# Patient Record
Sex: Male | Born: 2000 | Race: White | Hispanic: No | Marital: Single | Smoking: Never smoker
Health system: Southern US, Community
[De-identification: ages and names within clinical notes are randomized; demographics above are authoritative.]

## PROBLEM LIST (undated history)

## (undated) HISTORY — PX: HERNIA REPAIR: SHX51

---
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
## 2020-02-25 ENCOUNTER — Ambulatory Visit: Payer: Self-pay | Admitting: Nurse Practitioner

## 2020-03-04 ENCOUNTER — Ambulatory Visit (INDEPENDENT_AMBULATORY_CARE_PROVIDER_SITE_OTHER): Payer: Medicaid Other | Admitting: Nurse Practitioner

## 2020-03-04 DIAGNOSIS — Z5329 Procedure and treatment not carried out because of patient's decision for other reasons: Secondary | ICD-10-CM

## 2020-03-04 NOTE — Progress Notes (Signed)
no show

## 2020-06-24 DIAGNOSIS — F209 Schizophrenia, unspecified: Secondary | ICD-10-CM | POA: Diagnosis not present

## 2020-06-24 DIAGNOSIS — E785 Hyperlipidemia, unspecified: Secondary | ICD-10-CM | POA: Diagnosis not present

## 2020-06-24 DIAGNOSIS — G4709 Other insomnia: Secondary | ICD-10-CM | POA: Diagnosis not present

## 2020-06-24 DIAGNOSIS — E039 Hypothyroidism, unspecified: Secondary | ICD-10-CM | POA: Diagnosis not present

## 2020-06-24 DIAGNOSIS — F411 Generalized anxiety disorder: Secondary | ICD-10-CM | POA: Diagnosis not present

## 2020-07-08 DIAGNOSIS — F209 Schizophrenia, unspecified: Secondary | ICD-10-CM | POA: Diagnosis not present

## 2020-07-08 DIAGNOSIS — G4709 Other insomnia: Secondary | ICD-10-CM | POA: Diagnosis not present

## 2020-07-23 ENCOUNTER — Ambulatory Visit (INDEPENDENT_AMBULATORY_CARE_PROVIDER_SITE_OTHER): Payer: Medicaid Other | Admitting: Internal Medicine

## 2020-07-29 DIAGNOSIS — F209 Schizophrenia, unspecified: Secondary | ICD-10-CM | POA: Diagnosis not present

## 2020-08-12 DIAGNOSIS — R059 Cough, unspecified: Secondary | ICD-10-CM | POA: Diagnosis not present

## 2020-08-12 DIAGNOSIS — R509 Fever, unspecified: Secondary | ICD-10-CM | POA: Diagnosis not present

## 2020-08-12 DIAGNOSIS — R0989 Other specified symptoms and signs involving the circulatory and respiratory systems: Secondary | ICD-10-CM | POA: Diagnosis not present

## 2020-08-12 DIAGNOSIS — Z20822 Contact with and (suspected) exposure to covid-19: Secondary | ICD-10-CM | POA: Diagnosis not present

## 2020-08-13 ENCOUNTER — Telehealth: Payer: Self-pay

## 2020-08-13 NOTE — Telephone Encounter (Signed)
Transition Care Management Unsuccessful Follow-up Telephone Call  Date of discharge and from where:  08/12/2020 [facility] ED  Attempts:  1st Attempt  Reason for unsuccessful TCM follow-up call:  Unable to reach patient

## 2020-08-14 NOTE — Telephone Encounter (Signed)
Transition Care Management Unsuccessful Follow-up Telephone Call  Date of discharge and from where:  08/12/2020 [facility] ED  Attempts:  2nd Attempt  Reason for unsuccessful TCM follow-up call:  Left voice message

## 2020-08-15 NOTE — Telephone Encounter (Signed)
Transition Care Management Unsuccessful Follow-up Telephone Call  Date of discharge and from where:  08/12/2020 [facility] ED  Attempts:  3rd Attempt  Reason for unsuccessful TCM follow-up call:  Left voice message

## 2020-08-20 DIAGNOSIS — R509 Fever, unspecified: Secondary | ICD-10-CM | POA: Diagnosis not present

## 2020-08-20 DIAGNOSIS — R059 Cough, unspecified: Secondary | ICD-10-CM | POA: Diagnosis not present

## 2020-08-20 DIAGNOSIS — J029 Acute pharyngitis, unspecified: Secondary | ICD-10-CM | POA: Diagnosis not present

## 2020-08-21 ENCOUNTER — Telehealth: Payer: Self-pay

## 2020-08-21 NOTE — Telephone Encounter (Signed)
Transition Care Management Unsuccessful Follow-up Telephone Call  Date of discharge and from where:  08/20/2020 [facility] ED  Attempts:  1st Attempt  Reason for unsuccessful TCM follow-up call:  No answer/busy

## 2020-08-22 NOTE — Telephone Encounter (Signed)
Transition Care Management Unsuccessful Follow-up Telephone Call  Date of discharge and from where:  08/20/2020 [facility] ED  Attempts:  2nd Attempt  Reason for unsuccessful TCM follow-up call:  Unable to reach patient

## 2020-08-26 NOTE — Telephone Encounter (Signed)
Transition Care Management Unsuccessful Follow-up Telephone Call  Date of discharge and from where:  08/20/2020 [facility] ED  Attempts:  3rd Attempt  Reason for unsuccessful TCM follow-up call:  Unable to reach patient

## 2021-03-18 ENCOUNTER — Emergency Department (HOSPITAL_COMMUNITY)
Admission: EM | Admit: 2021-03-18 | Discharge: 2021-03-18 | Disposition: A | Discharge status: Home / Self Care | Payer: Medicaid Other | Attending: Emergency Medicine | Admitting: Emergency Medicine

## 2021-03-18 ENCOUNTER — Other Ambulatory Visit: Payer: Self-pay

## 2021-03-18 ENCOUNTER — Encounter (HOSPITAL_COMMUNITY): Payer: Self-pay | Admitting: Student

## 2021-03-18 ENCOUNTER — Emergency Department (HOSPITAL_COMMUNITY): Payer: Medicaid Other

## 2021-03-18 DIAGNOSIS — J45909 Unspecified asthma, uncomplicated: Secondary | ICD-10-CM | POA: Insufficient documentation

## 2021-03-18 DIAGNOSIS — U071 COVID-19: Secondary | ICD-10-CM | POA: Insufficient documentation

## 2021-03-18 DIAGNOSIS — R509 Fever, unspecified: Secondary | ICD-10-CM | POA: Diagnosis present

## 2021-03-18 LAB — RESP PANEL BY RT-PCR (FLU A&B, COVID) ARPGX2
Influenza A by PCR: NEGATIVE
Influenza B by PCR: NEGATIVE
SARS Coronavirus 2 by RT PCR: POSITIVE — AB

## 2021-03-18 MED ORDER — FLUTICASONE PROPIONATE 50 MCG/ACT NA SUSP: 1.0000 | Freq: Every day | NASAL | 0 refills | Status: AC

## 2021-03-18 MED ORDER — BENZONATATE 100 MG PO CAPS: 100.0000 mg | ORAL_CAPSULE | Freq: Three times a day (TID) | ORAL | 0 refills | Status: AC | PRN

## 2021-03-18 NOTE — Discharge Instructions (Addendum)
Your COVID-19 test returned positive in the emergency department.  Given your fever to started yesterday, yesterday will be your day 0 of symptoms. We are instructing patient's with COVID 19 or symptoms of COVID 19 to follow the below instructions regardless of vaccination status:  Your first day of symptoms is day 0 - Stay home for days 0-5.  - If you have no symptoms or your symptoms are resolving after 5 days, you can leave your house on day 6--> Continue to wear a mask around others for 5 additional days. - If you have a fever, continue to stay home until your fever resolves.  We are sending home with Flonase use 1 spray per nostril daily as needed for congestion and Tessalon to take every 8 hours as needed for coughing.  Please take Motrin/Tylenol per over-the-counter dosing to help with discomfort/fevers.  We have prescribed you new medication(s) today. Discuss the medications prescribed today with your pharmacist as they can have adverse effects and interactions with your other medicines including over the counter and prescribed medications. Seek medical evaluation if you start to experience new or abnormal symptoms after taking one of these medicines, seek care immediately if you start to experience difficulty breathing, feeling of your throat closing, facial swelling, or rash as these could be indications of a more serious allergic reaction   Please follow up with primary care within 3-5 days for re-evaluation- call prior to going to the office to make them aware of your symptoms as some offices are altering their method of seeing patients with COVID 19 symptoms, we have also provided our [place] covid clinic for follow up as well.  Return to the ER for new or worsening symptoms including but not limited to increased work of breathing, chest pain, passing out, or any other concerns.       Person Under Monitoring Name: Frederick Pearson  Location: [address] [place]  [address]   Infection Prevention Recommendations for Individuals Confirmed to have, or Being Evaluated for, 2019 Novel Coronavirus (COVID-19) Infection Who Receive Care at Home  Individuals who are confirmed to have, or are being evaluated for, COVID-19 should follow the prevention steps below until a healthcare provider or local or state health department says they can return to normal activities.  Stay home except to get medical care You should restrict activities outside your home, except for getting medical care. Do not go to work, school, or public areas, and do not use public transportation or taxis.  Call ahead before visiting your doctor Before your medical appointment, call the healthcare provider and tell them that you have, or are being evaluated for, COVID-19 infection. This will help the healthcare provider's office take steps to keep other people from getting infected. Ask your healthcare provider to call the local or state health department.  Monitor your symptoms Seek prompt medical attention if your illness is worsening (e.g., difficulty breathing). Before going to your medical appointment, call the healthcare provider and tell them that you have, or are being evaluated for, COVID-19 infection. Ask your healthcare provider to call the local or state health department.  Wear a facemask You should wear a facemask that covers your nose and mouth when you are in the same room with other people and when you visit a healthcare provider. People who live with or visit you should also wear a facemask while they are in the same room with you.  Separate yourself from other people in your home  As much as possible, you should stay in a different room from other people in your home. Also, you should use a separate bathroom, if available.  Avoid sharing household items You should not share dishes, drinking glasses, cups, eating utensils, towels, bedding, or other items with other  people in your home. After using these items, you should wash them thoroughly with soap and water.  Cover your coughs and sneezes Cover your mouth and nose with a tissue when you cough or sneeze, or you can cough or sneeze into your sleeve. Throw used tissues in a lined trash can, and immediately wash your hands with soap and water for at least 20 seconds or use an alcohol-based hand rub.  Wash your [employer] your hands often and thoroughly with soap and water for at least 20 seconds. You can use an alcohol-based hand sanitizer if soap and water are not available and if your hands are not visibly dirty. Avoid touching your eyes, nose, and mouth with unwashed hands.   Prevention Steps for Caregivers and Household Members of Individuals Confirmed to have, or Being Evaluated for, COVID-19 Infection Being Cared for in the Home  If you live with, or provide care at home for, a person confirmed to have, or being evaluated for, COVID-19 infection please follow these guidelines to prevent infection:  Follow healthcare provider's instructions Make sure that you understand and can help the patient follow any healthcare provider instructions for all care.  Provide for the patient's basic needs You should help the patient with basic needs in the home and provide support for getting groceries, prescriptions, and other personal needs.  Monitor the patient's symptoms If they are getting sicker, call his or her medical provider and tell them that the patient has, or is being evaluated for, COVID-19 infection. This will help the healthcare provider's office take steps to keep other people from getting infected. Ask the healthcare provider to call the local or state health department.  Limit the number of people who have contact with the patient If possible, have only one caregiver for the patient. Other household members should stay in another home or place of residence. If this is not possible,  they should stay in another room, or be separated from the patient as much as possible. Use a separate bathroom, if available. Restrict visitors who do not have an essential need to be in the home.  Keep older adults, very young children, and other sick people away from the patient Keep older adults, very young children, and those who have compromised immune systems or chronic health conditions away from the patient. This includes people with chronic heart, lung, or kidney conditions, diabetes, and cancer.  Ensure good ventilation Make sure that shared spaces in the home have good air flow, such as from an air conditioner or an opened window, weather permitting.  Wash your hands often Wash your hands often and thoroughly with soap and water for at least 20 seconds. You can use an alcohol based hand sanitizer if soap and water are not available and if your hands are not visibly dirty. Avoid touching your eyes, nose, and mouth with unwashed hands. Use disposable paper towels to dry your hands. If not available, use dedicated cloth towels and replace them when they become wet.  Wear a facemask and gloves Wear a disposable facemask at all times in the room and gloves when you touch or have contact with the patient's blood, body fluids, and/or secretions or excretions, such  as sweat, saliva, sputum, nasal mucus, vomit, urine, or feces.  Ensure the mask fits over your nose and mouth tightly, and do not touch it during use. Throw out disposable facemasks and gloves after using them. Do not reuse. Wash your hands immediately after removing your facemask and gloves. If your personal clothing becomes contaminated, carefully remove clothing and launder. Wash your hands after handling contaminated clothing. Place all used disposable facemasks, gloves, and other waste in a lined container before disposing them with other household waste. Remove gloves and wash your hands immediately after handling these  items.  Do not share dishes, glasses, or other household items with the patient Avoid sharing household items. You should not share dishes, drinking glasses, cups, eating utensils, towels, bedding, or other items with a patient who is confirmed to have, or being evaluated for, COVID-19 infection. After the person uses these items, you should wash them thoroughly with soap and water.  Wash laundry thoroughly Immediately remove and wash clothes or bedding that have blood, body fluids, and/or secretions or excretions, such as sweat, saliva, sputum, nasal mucus, vomit, urine, or feces, on them. Wear gloves when handling laundry from the patient. Read and follow directions on labels of laundry or clothing items and detergent. In general, wash and dry with the warmest temperatures recommended on the label.  Clean all areas the individual has used often Clean all touchable surfaces, such as counters, tabletops, doorknobs, bathroom fixtures, toilets, phones, keyboards, tablets, and bedside tables, every day. Also, clean any surfaces that may have blood, body fluids, and/or secretions or excretions on them. Wear gloves when cleaning surfaces the patient has come in contact with. Use a diluted bleach solution (e.g., dilute bleach with 1 part bleach and 10 parts water) or a household disinfectant with a label that says EPA-registered for coronaviruses. To make a bleach solution at home, add 1 tablespoon of bleach to 1 quart (4 cups) of water. For a larger supply, add  cup of bleach to 1 gallon (16 cups) of water. Read labels of cleaning products and follow recommendations provided on product labels. Labels contain instructions for safe and effective use of the cleaning product including precautions you should take when applying the product, such as wearing gloves or eye protection and making sure you have good ventilation during use of the product. Remove gloves and wash hands immediately after  cleaning.  Monitor yourself for signs and symptoms of illness Caregivers and household members are considered close contacts, should monitor their health, and will be asked to limit movement outside of the home to the extent possible. Follow the monitoring steps for close contacts listed on the symptom monitoring form.   ? If you have additional questions, contact your local health department or call the epidemiologist on call at [phone] (available 24/7). ? This guidance is subject to change. For the most up-to-date guidance from [facility], please refer to their website: [email or web address]

## 2021-03-18 NOTE — ED Provider Notes (Signed)
[facility] EMERGENCY DEPARTMENT Provider Note   CSN: [id number] Arrival date & time: 03/18/21  1149     History Chief Complaint  Patient presents with   Covid Exposure    Frederick Pearson is a 20 y.o. male with a history of asthma who presents to the emergency department with complaints of fever that started yesterday.  Patient states he has been congested with some ear discomfort and cough for the past 2 weeks, yesterday spiked a high fever.  No alleviating or aggravating factors.  No intervention prior to arrival.  He has had multiple sick contacts with similar been diagnosed with COVID.  He denies chest pain, dyspnea, wheezing, nausea, vomiting, abdominal pain, or diarrhea.  HPI     Past Medical History:  Diagnosis Date   Asthma     There are no problems to display for this patient.   Past Surgical History:  Procedure Laterality Date   HERNIA REPAIR         No family history on file.  Social History   Tobacco Use   Smoking status: Never   Smokeless tobacco: Former  Substance Use Topics   Alcohol use: No   Drug use: No    Home Medications Prior to Admission medications   Not on File    Allergies    Amoxicillin and Penicillins  Review of Systems   Review of Systems  Constitutional:  Positive for fever.  HENT:  Positive for congestion, ear pain and sore throat.   Respiratory:  Positive for cough. Negative for shortness of breath and wheezing.   Cardiovascular:  Negative for chest pain and leg swelling.  Gastrointestinal:  Negative for abdominal pain, nausea and vomiting.  Neurological:  Negative for syncope.  All other systems reviewed and are negative.  Physical Exam Updated Vital Signs BP 118/69 (BP Location: Right Arm)   Pulse (!) 57   Temp 97.8 F (36.6 C) (Oral)   Resp 17   Ht 5\' 11"  (1.803 m)   Wt 89.8 kg   SpO2 97%   BMI 27.61 kg/m   Physical Exam Vitals and nursing note reviewed.  Constitutional:      General: He is not in acute  distress.    Appearance: He is well-developed. He is not toxic-appearing.  HENT:     Head: Normocephalic and atraumatic.     Right Ear: Ear canal normal. Tympanic membrane is not perforated, erythematous, retracted or bulging.     Left Ear: Ear canal normal. Tympanic membrane is not perforated, erythematous, retracted or bulging.     Ears:     Comments: No mastoid erythema/swellng/tenderness.     Nose:     Right Sinus: No maxillary sinus tenderness or frontal sinus tenderness.     Left Sinus: No maxillary sinus tenderness or frontal sinus tenderness.     Mouth/Throat:     Pharynx: Oropharynx is clear. Uvula midline. No oropharyngeal exudate or posterior oropharyngeal erythema.     Comments: Posterior oropharynx is symmetric appearing. Patient tolerating own secretions without difficulty. No trismus. No drooling. No hot potato voice. No swelling beneath the tongue, submandibular compartment is soft.  Eyes:     General:        Right eye: No discharge.        Left eye: No discharge.     Conjunctiva/sclera: Conjunctivae normal.  Cardiovascular:     Rate and Rhythm: Normal rate and regular rhythm.  Pulmonary:     Effort: Pulmonary effort is normal. No  respiratory distress.     Breath sounds: Normal breath sounds. No wheezing, rhonchi or rales.  Abdominal:     General: There is no distension.     Palpations: Abdomen is soft.     Tenderness: There is no abdominal tenderness.  Musculoskeletal:     Cervical back: Neck supple. No rigidity.  Lymphadenopathy:     Cervical: No cervical adenopathy.  Skin:    General: Skin is warm and dry.     Findings: No rash.  Neurological:     Mental Status: He is alert.  Psychiatric:        Behavior: Behavior normal.    ED Results / Procedures / Treatments   Labs (all labs ordered are listed, but only abnormal results are displayed) Labs Reviewed  RESP PANEL BY RT-PCR (FLU A&B, COVID) ARPGX2 - Abnormal; Notable for the following components:       Result Value   SARS Coronavirus 2 by RT PCR POSITIVE (*)    All other components within normal limits    EKG None  Radiology DG Chest Portable 1 View  Result Date: 03/18/2021 CLINICAL DATA:  Cough EXAM: PORTABLE CHEST 1 VIEW COMPARISON:  03/11/2021 FINDINGS: The heart size and mediastinal contours are within normal limits. Both lungs are clear. The visualized skeletal structures are unremarkable. IMPRESSION: No active disease. Electronically Signed   By: Signa Kell M.D.   On: 03/18/2021 15:55    Procedures Procedures   Medications Ordered in ED Medications - No data to display  ED Course  I have reviewed the triage vital signs and the nursing notes.  Pertinent labs & imaging results that were available during my care of the patient were reviewed by me and considered in my medical decision making (see chart for details).   Frederick Pearson was evaluated in Emergency Department on 03/18/2021 for the symptoms described in the history of present illness. He/she was evaluated in the context of the global COVID-19 pandemic, which necessitated consideration that the patient might be at risk for infection with the SARS-CoV-2 virus that causes COVID-19. Institutional protocols and algorithms that pertain to the evaluation of patients at risk for COVID-19 are in a state of rapid change based on information released by regulatory bodies including the CDC and federal and state organizations. These policies and algorithms were followed during the patient's care in the ED.  MDM Rules/Calculators/A&P                          Patient presents to the ED with URI sxs x 2 weeks, fever yesterday. Patient is nontoxic, in no acute distress, vitals fairly unremarkable.   Additional history obtained:  Additional history obtained from chart review & nursing note review.   Lab Tests:  I Ordered, reviewed, and interpreted labs, which included:  Covid: Positive Influenza; Negative.  Imaging Studies ordered:   I ordered imaging studies which included CXR, I independently visualized and interpreted imaging which showed No active disease.   ED Course:  Exam is without signs of AOM, AOE, or mastoiditis. Oropharyngeal exam is benign, doubt strep/PRA/PTA. No sinus tenderness. No meningeal signs. Lungs are CTA without focal adventitious sounds, no signs of increased work of breathing, CXR without infiltrate, doubt CAP. No wheezing to suggest asthma exacerbation.  COVID testing is positive which is likely the underlying etiology of patient's symptoms.  Given he just developed fever yesterday instructed him to treat this is day 0.  We discussed symptomatic  care versus different antiviral therapy, risk/benefits discussed, shared decision making- will proceed with supportive care. I discussed results, treatment plan, need for follow-up, and return precautions with the patient. Provided opportunity for questions, patient confirmed understanding and is in agreement with plan.   Portions of this note were generated with Scientist, clinical (histocompatibility and immunogenetics). Dictation errors may occur despite best attempts at proofreading.  Final Clinical Impression(s) / ED Diagnoses Final diagnoses:  COVID-19    Rx / DC Orders ED Discharge Orders          Ordered    fluticasone (FLONASE) 50 MCG/ACT nasal spray  Daily        03/18/21 1632    benzonatate (TESSALON) 100 MG capsule  3 times daily PRN        03/18/21 1632             Brelynn Wheller, Pleas Koch, PA-C 03/18/21 1638    Eber Hong, MD 03/19/21 1254

## 2021-03-18 NOTE — ED Triage Notes (Signed)
Pt. States everyone in the house has covid. Pt. States they haven't felt well in 2 weeks. Pt. Complains of earache and throat pain.

## 2021-08-12 ENCOUNTER — Ambulatory Visit: Payer: Medicaid Other | Admitting: Family Medicine

## 2021-11-08 IMAGING — DX DG CHEST 1V PORT
1 series · 1 of 1 positions shown · non-contrast
Comparison: 03/11/2021

CLINICAL DATA: Cough

EXAM:
PORTABLE CHEST 1 VIEW

[chest ap]
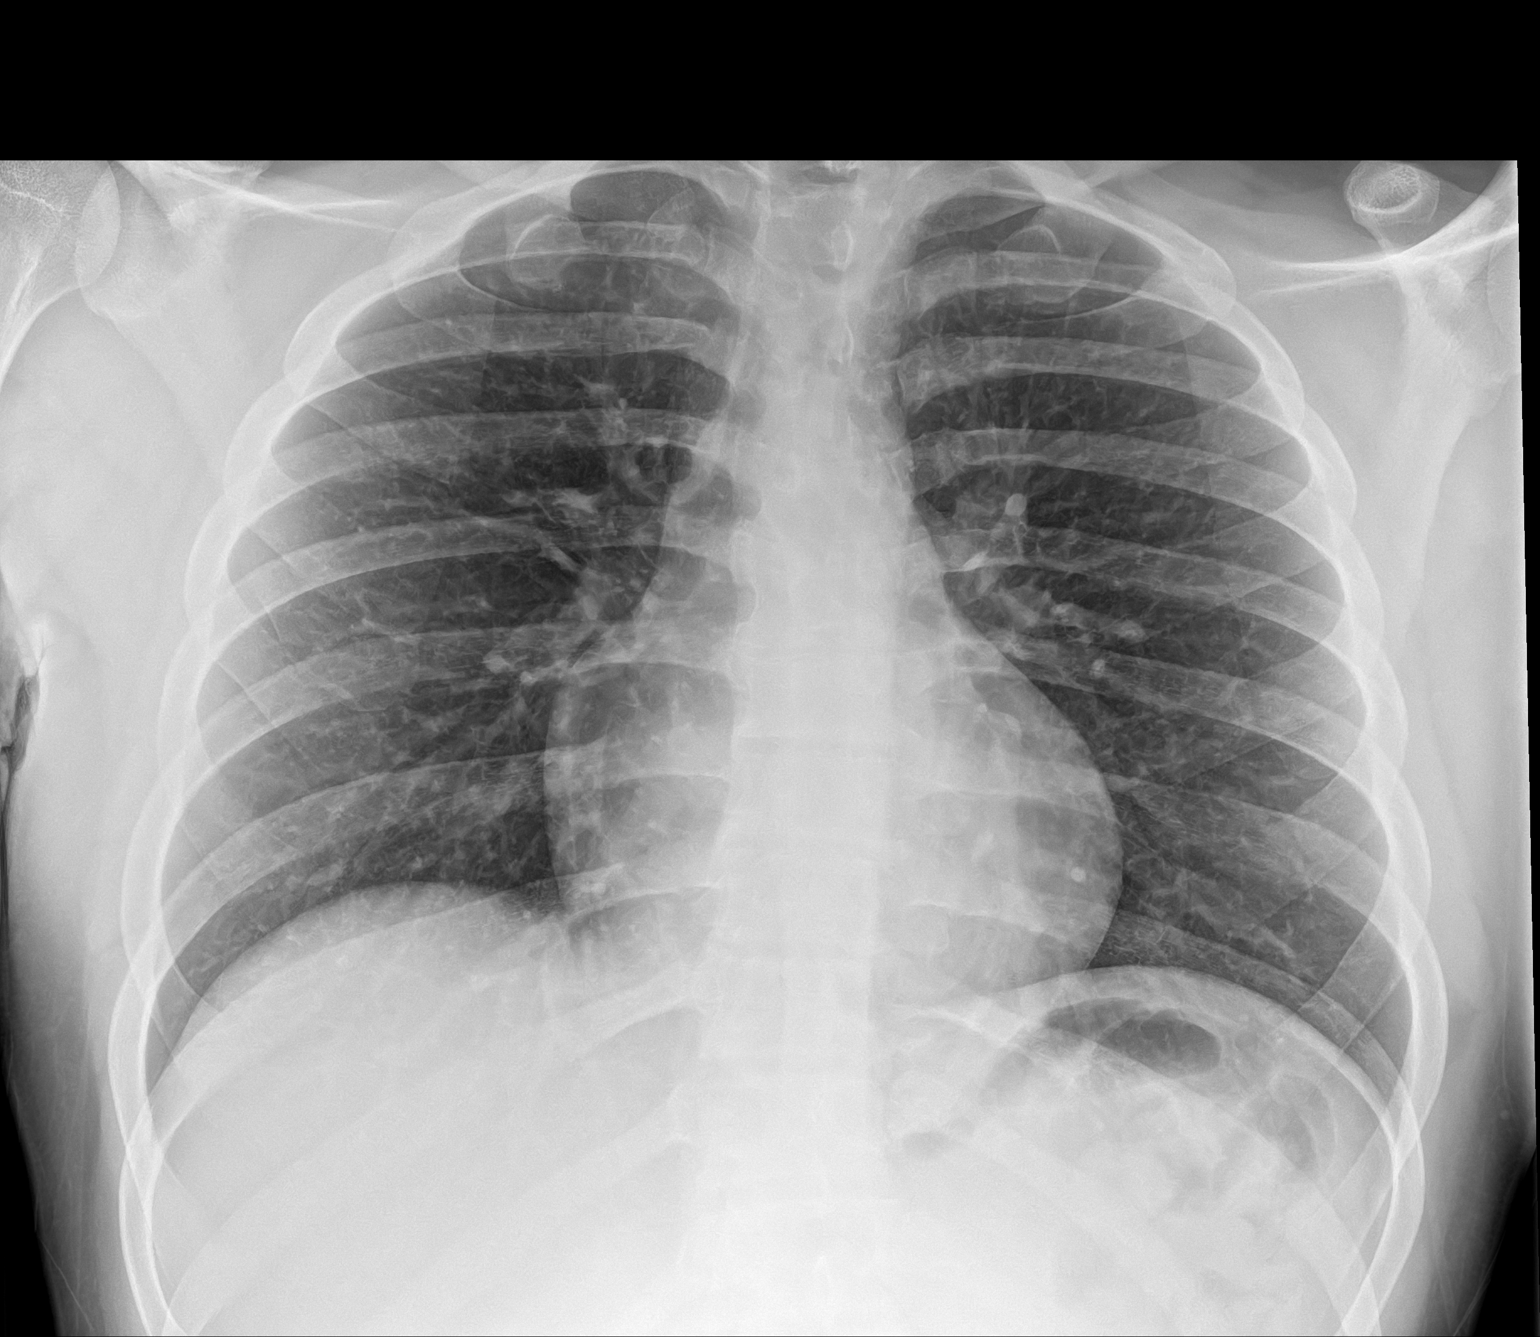

[1 of 1 positions shown; findings below may reference images not displayed]

FINDINGS: The heart size and mediastinal contours are within normal limits.
Both lungs are clear. The visualized skeletal structures are
unremarkable.
IMPRESSION: No active disease.

## 2023-07-16 ENCOUNTER — Other Ambulatory Visit: Payer: Self-pay

## 2023-07-16 ENCOUNTER — Emergency Department (HOSPITAL_COMMUNITY)
Admission: EM | Admit: 2023-07-16 | Discharge: 2023-07-16 | Disposition: A | Discharge status: Home / Self Care | Payer: MEDICAID | Attending: Emergency Medicine | Admitting: Emergency Medicine

## 2023-07-16 DIAGNOSIS — R451 Restlessness and agitation: Secondary | ICD-10-CM | POA: Diagnosis not present

## 2023-07-16 DIAGNOSIS — R42 Dizziness and giddiness: Secondary | ICD-10-CM | POA: Insufficient documentation

## 2023-07-16 DIAGNOSIS — F1990 Other psychoactive substance use, unspecified, uncomplicated: Secondary | ICD-10-CM | POA: Diagnosis not present

## 2023-07-16 LAB — URINALYSIS, ROUTINE W REFLEX MICROSCOPIC
Bacteria, UA: NONE SEEN
Bilirubin Urine: NEGATIVE
Glucose, UA: NEGATIVE mg/dL
Hgb urine dipstick: NEGATIVE
Ketones, ur: NEGATIVE mg/dL
Leukocytes,Ua: NEGATIVE
Nitrite: NEGATIVE
Protein, ur: NEGATIVE mg/dL
Specific Gravity, Urine: 1.02 (ref 1.005–1.030)
pH: 5 (ref 5.0–8.0)

## 2023-07-16 LAB — COMPREHENSIVE METABOLIC PANEL
ALT: 12 U/L (ref 0–44)
AST: 16 U/L (ref 15–41)
Albumin: 3.7 g/dL (ref 3.5–5.0)
Alkaline Phosphatase: 45 U/L (ref 38–126)
Anion gap: 10 (ref 5–15)
BUN: 11 mg/dL (ref 6–20)
CO2: 25 mmol/L (ref 22–32)
Calcium: 9.1 mg/dL (ref 8.9–10.3)
Chloride: 103 mmol/L (ref 98–111)
Creatinine, Ser: 0.91 mg/dL (ref 0.61–1.24)
GFR, Estimated: >60 mL/min (ref >60)
Glucose, Bld: 107 mg/dL — ABNORMAL HIGH (ref 70–99)
Potassium: 4 mmol/L (ref 3.5–5.1)
Sodium: 138 mmol/L (ref 135–145)
Total Bilirubin: 0.2 mg/dL — ABNORMAL LOW (ref 0.3–1.2)
Total Protein: 6 g/dL — ABNORMAL LOW (ref 6.5–8.1)

## 2023-07-16 LAB — RAPID URINE DRUG SCREEN, HOSP PERFORMED
Amphetamines: POSITIVE — AB
Barbiturates: NOT DETECTED
Benzodiazepines: POSITIVE — AB
Cocaine: POSITIVE — AB
Opiates: POSITIVE — AB
Tetrahydrocannabinol: POSITIVE — AB

## 2023-07-16 LAB — CBC
HCT: 42 % (ref 39.0–52.0)
Hemoglobin: 13.9 g/dL (ref 13.0–17.0)
MCH: 28.8 pg (ref 26.0–34.0)
MCHC: 33.1 g/dL (ref 30.0–36.0)
MCV: 87 fL (ref 80.0–100.0)
Platelets: 292 10*3/uL (ref 150–400)
RBC: 4.83 MIL/uL (ref 4.22–5.81)
RDW: 11.9 % (ref 11.5–15.5)
WBC: 9.2 10*3/uL (ref 4.0–10.5)
nRBC: 0 % (ref 0.0–0.2)

## 2023-07-16 LAB — ETHANOL: Alcohol, Ethyl (B): <10 mg/dL (ref <10)

## 2023-07-16 LAB — LIPASE, BLOOD: Lipase: 24 U/L (ref 11–51)

## 2023-07-16 MED ORDER — SODIUM CHLORIDE 0.9 % IV BOLUS
1000.0000 mL | Freq: Once | INTRAVENOUS | Status: AC
  Administered 2023-07-16: 1000 mL via INTRAVENOUS

## 2023-07-16 NOTE — ED Provider Notes (Signed)
[place] EMERGENCY DEPARTMENT AT [facility] Provider Note   CSN: [id number] Arrival date & time: 07/16/23  0142     History  Chief Complaint  Patient presents with   Ingestion    Frederick Pearson is a 22 y.o. male.  22 y/o male with hx of ADHD, suboxone use presents to the ED for evaluation of episodic lightheadedness. Reports recurrence of symptoms primarily with position change. Is slightly improved with slower transition in movements. States he has donated plasma 5 times this month; think this may be related to his symptoms. Has also been off of his prescribed medications for the past week; hasn't had his Seroquel so has not been sleeping well. Per RN, also reported using Xanax; however, patient did not disclose this to me. Denied use of ETOH and illicit substances. No fever or recent illness.  The history is provided by the patient. No language interpreter was used.  Ingestion       Home Medications Prior to Admission medications   Medication Sig Start Date End Date Taking? Authorizing Provider  benzonatate (TESSALON) 100 MG capsule Take 1 capsule (100 mg total) by mouth 3 (three) times daily as needed for cough. 03/18/21   Petrucelli, Samantha R, PA-C  fluticasone (FLONASE) 50 MCG/ACT nasal spray Place 1 spray into both nostrils daily. 03/18/21   Petrucelli, Pleas Koch, PA-C      Allergies    Amoxicillin and Penicillins    Review of Systems   Review of Systems Ten systems reviewed and are negative for acute change, except as noted in the HPI.    Physical Exam Updated Vital Signs BP 121/76   Pulse 79   Temp 98.4 F (36.9 C)   Resp 17   Ht 5\' 11"  (1.803 m)   Wt 89.8 kg   SpO2 100%   BMI 27.61 kg/m   Physical Exam Constitutional:      General: He is not in acute distress.    Appearance: He is not ill-appearing.     Comments: Nontoxic-appearing, though appears under the influence of unknown substance.  HENT:     Head: Normocephalic and atraumatic.      Right Ear: External ear normal.     Left Ear: External ear normal.  Cardiovascular:     Rate and Rhythm: Normal rate and regular rhythm.     Pulses: Normal pulses.  Pulmonary:     Effort: Pulmonary effort is normal. No respiratory distress.     Breath sounds: Normal breath sounds. No stridor. No wheezing.     Comments: Respirations even and unlabored. Lungs CTAB. Abdominal:     General: There is no distension.     Palpations: Abdomen is soft.     Tenderness: There is no abdominal tenderness. There is no guarding.     Comments: Soft, nontender abdomen  Musculoskeletal:        General: Normal range of motion.     Cervical back: Normal range of motion.  Skin:    General: Skin is warm and dry.  Neurological:     Mental Status: He is alert.     Coordination: Coordination normal.     Comments: Speech is clear, goal oriented.  Moving all extremities spontaneously.  Psychiatric:        Mood and Affect: Affect is labile.        Speech: Speech is tangential.        Behavior: Behavior is agitated.     ED Results / Procedures /  Treatments   Labs (all labs ordered are listed, but only abnormal results are displayed) Labs Reviewed  COMPREHENSIVE METABOLIC PANEL - Abnormal; Notable for the following components:      Result Value   Glucose, Bld 107 (*)    Total Protein 6.0 (*)    Total Bilirubin 0.2 (*)    All other components within normal limits  RAPID URINE DRUG SCREEN, HOSP PERFORMED - Abnormal; Notable for the following components:   Opiates POSITIVE (*)    Cocaine POSITIVE (*)    Benzodiazepines POSITIVE (*)    Amphetamines POSITIVE (*)    Tetrahydrocannabinol POSITIVE (*)    All other components within normal limits  LIPASE, BLOOD  CBC  URINALYSIS, ROUTINE W REFLEX MICROSCOPIC  ETHANOL    EKG EKG Interpretation Date/Time:  Saturday July 16 2023 01:38:04 EDT Ventricular Rate:  93 PR Interval:  156 QRS Duration:  94 QT Interval:  326 QTC Calculation: 405 R  Axis:   70  Text Interpretation: Normal sinus rhythm with sinus arrhythmia Normal ECG No previous ECGs available Confirmed by Gilda Crease [phone]) on 07/16/2023 2:14:03 AM  Radiology No results found.  Procedures Procedures    Medications Ordered in ED Medications  sodium chloride 0.9 % bolus 1,000 mL (0 mLs Intravenous Stopped 07/16/23 0432)    ED Course/ Medical Decision Making/ A&P Clinical Course as of 07/16/23 0538  Sat Jul 16, 2023  0431 Orthostatic vital signs negative. Labs reassuring. UDS notably positive for all substances except barbiturates.  [KH]    Clinical Course User Index [KH] Antony Madura, PA-C                                 Medical Decision Making Amount and/or Complexity of Data Reviewed Labs: ordered.   This patient presents to the ED for concern of lightheadedness, this involves an extensive number of treatment options, and is a complaint that carries with it a high risk of complications and morbidity.  The differential diagnosis includes dehydration vs arrhythmia vs electrolyte derangement vs polysubstance use/abuse   Co morbidities that complicate the patient evaluation  ADHD Suboxone use   Additional history obtained:  Additional history obtained from sister and girlfriend, at bedside   Lab Tests:  I Ordered, and personally interpreted labs.  The pertinent results include:  CBC, CMP, Lipase, Ethanol, and UA reassuring and WNL. UDS positive for opiates, cocaine, benzodiazepines, amphetamines, and marijuana.   Cardiac Monitoring:  The patient was maintained on a cardiac monitor.  I personally viewed and interpreted the cardiac monitored which showed an underlying rhythm of: NSR   Medicines ordered and prescription drug management:  I ordered medication including IVF for hydration Reevaluation of the patient after these medicines showed that the patient improved I have reviewed the patients home medicines and have made  adjustments as needed   Test Considered:  CXR   Problem List / ED Course:  22 year old male presents to the emergency department with complaints of lightheadedness.  Symptoms mostly aggravated with change in position suggestive more of an orthostatic component, possible dehydration.  He was given IV fluids in the ED with improvement in his symptoms.  Orthostatic vital sign testing negative at bedside. Workup without significant electrolyte derangement.  Liver and kidney function preserved.  His urinalysis does not suggest urinary tract infection; however, his UDS is grossly positive.  Suspect that polysubstance use may be contributing to his symptoms as well.  No evidence of concerning arrhythmia on EKG. Plan for continued oral hydration and outpatient primary care follow-up.  No indication for further emergent workup at this time.   Reevaluation:  After the interventions noted above, I reevaluated the patient and found that they have :improved   Social Determinants of Health:  Polysubstance abuse hx   Dispostion:  After consideration of the diagnostic results and the patients response to treatment, I feel that the patent would benefit from persistent outpatient oral hydration. Encouraged patient to refrain from illicit drug use. Given resource guide for substance abuse services. Return precautions discussed and provided. Patient discharged in stable condition with no unaddressed concerns.          Final Clinical Impression(s) / ED Diagnoses Final diagnoses:  Lightheadedness  Polysubstance use disorder    Rx / DC Orders ED Discharge Orders     None         Antony Madura, PA-C 07/16/23 0555    Gilda Crease, MD 07/17/23 [phone]

## 2023-07-16 NOTE — ED Notes (Signed)
The pts sister is with him  she's is not much help

## 2023-07-16 NOTE — Discharge Instructions (Addendum)
Your in the ED was reassuring, but your drug screen was positive for numerous substances.  This may be contributing to your symptoms.  We recommend that you drink plenty of water to prevent dehydration.  Eat regular meals.  We have provided you with a resource guide for local outpatient counseling and substance abuse services.  Follow-up with a primary doctor.

## 2023-07-16 NOTE — ED Notes (Signed)
Patient and family was given cups of water.

## 2023-07-16 NOTE — ED Triage Notes (Signed)
The pt is falling asleep in triage  unsure  what is going on with this pt  he talks for awhile then goes to sleep   he reports that he does not  know  what is going  on
